# Patient Record
Sex: Male | Born: 1950 | Race: White | Hispanic: No | Smoking: Former smoker
Health system: Southern US, Community
[De-identification: ages and names within clinical notes are randomized; demographics above are authoritative.]

## PROBLEM LIST (undated history)

## (undated) DIAGNOSIS — J45909 Unspecified asthma, uncomplicated: Secondary | ICD-10-CM

## (undated) DIAGNOSIS — I10 Essential (primary) hypertension: Secondary | ICD-10-CM

## (undated) DIAGNOSIS — L309 Dermatitis, unspecified: Secondary | ICD-10-CM

## (undated) DIAGNOSIS — J449 Chronic obstructive pulmonary disease, unspecified: Secondary | ICD-10-CM

## (undated) DIAGNOSIS — R569 Unspecified convulsions: Secondary | ICD-10-CM

## (undated) HISTORY — DX: Chronic obstructive pulmonary disease, unspecified: J44.9

## (undated) HISTORY — DX: Unspecified convulsions: R56.9

## (undated) HISTORY — DX: Unspecified asthma, uncomplicated: J45.909

## (undated) HISTORY — DX: Essential (primary) hypertension: I10

## (undated) HISTORY — PX: GALLBLADDER SURGERY: SHX652

## (undated) HISTORY — DX: Dermatitis, unspecified: L30.9

## (undated) HISTORY — PX: LEG SURGERY: SHX1003

---

## 1999-08-24 ENCOUNTER — Ambulatory Visit (HOSPITAL_COMMUNITY): Admission: RE | Admit: 1999-08-24 | Discharge: 1999-08-24 | Payer: Self-pay | Admitting: Infectious Diseases

## 1999-08-24 ENCOUNTER — Encounter: Admission: RE | Admit: 1999-08-24 | Discharge: 1999-08-24 | Payer: Self-pay | Admitting: Infectious Diseases

## 1999-09-07 ENCOUNTER — Encounter: Admission: RE | Admit: 1999-09-07 | Discharge: 1999-09-07 | Payer: Self-pay | Admitting: Infectious Diseases

## 1999-09-07 ENCOUNTER — Ambulatory Visit (HOSPITAL_COMMUNITY): Admission: RE | Admit: 1999-09-07 | Discharge: 1999-09-07 | Payer: Self-pay | Admitting: Infectious Diseases

## 1999-09-28 ENCOUNTER — Encounter: Admission: RE | Admit: 1999-09-28 | Discharge: 1999-09-28 | Payer: Self-pay | Admitting: Infectious Diseases

## 1999-10-25 ENCOUNTER — Encounter: Admission: RE | Admit: 1999-10-25 | Discharge: 1999-10-25 | Payer: Self-pay | Admitting: Infectious Diseases

## 1999-11-08 ENCOUNTER — Ambulatory Visit (HOSPITAL_COMMUNITY): Admission: RE | Admit: 1999-11-08 | Discharge: 1999-11-08 | Payer: Self-pay | Admitting: Infectious Diseases

## 1999-11-08 ENCOUNTER — Encounter: Admission: RE | Admit: 1999-11-08 | Discharge: 1999-11-08 | Payer: Self-pay | Admitting: Infectious Diseases

## 1999-11-15 ENCOUNTER — Encounter: Admission: RE | Admit: 1999-11-15 | Discharge: 1999-11-15 | Payer: Self-pay | Admitting: Infectious Diseases

## 1999-12-06 ENCOUNTER — Encounter: Admission: RE | Admit: 1999-12-06 | Discharge: 1999-12-06 | Payer: Self-pay | Admitting: Infectious Diseases

## 1999-12-06 ENCOUNTER — Ambulatory Visit (HOSPITAL_COMMUNITY): Admission: RE | Admit: 1999-12-06 | Discharge: 1999-12-06 | Payer: Self-pay | Admitting: Infectious Diseases

## 1999-12-29 ENCOUNTER — Encounter: Admission: RE | Admit: 1999-12-29 | Discharge: 1999-12-29 | Payer: Self-pay | Admitting: Infectious Diseases

## 1999-12-29 ENCOUNTER — Ambulatory Visit (HOSPITAL_COMMUNITY): Admission: RE | Admit: 1999-12-29 | Discharge: 1999-12-29 | Payer: Self-pay | Admitting: Infectious Diseases

## 2000-01-24 ENCOUNTER — Encounter: Admission: RE | Admit: 2000-01-24 | Discharge: 2000-01-24 | Payer: Self-pay | Admitting: Infectious Diseases

## 2000-01-25 ENCOUNTER — Ambulatory Visit (HOSPITAL_COMMUNITY): Admission: RE | Admit: 2000-01-25 | Discharge: 2000-01-25 | Payer: Self-pay | Admitting: Infectious Diseases

## 2000-02-15 ENCOUNTER — Encounter: Payer: Self-pay | Admitting: General Surgery

## 2000-02-16 ENCOUNTER — Ambulatory Visit (HOSPITAL_COMMUNITY): Admission: RE | Admit: 2000-02-16 | Discharge: 2000-02-16 | Payer: Self-pay | Admitting: General Surgery

## 2000-02-21 ENCOUNTER — Encounter: Admission: RE | Admit: 2000-02-21 | Discharge: 2000-02-21 | Payer: Self-pay | Admitting: Infectious Diseases

## 2009-06-08 ENCOUNTER — Observation Stay: Payer: Self-pay | Admitting: Internal Medicine

## 2009-06-08 ENCOUNTER — Ambulatory Visit: Payer: Self-pay | Admitting: Cardiovascular Disease

## 2011-02-12 IMAGING — US US CAROTID DUPLEX BILAT
1 series · 17 of 24 positions shown · non-contrast
Comparison: none

REASON FOR EXAM: dizziness
COMMENTS:

[Series 1: us carotid duplex bilat · 17 of 62 slices shown]
[im 1/62]
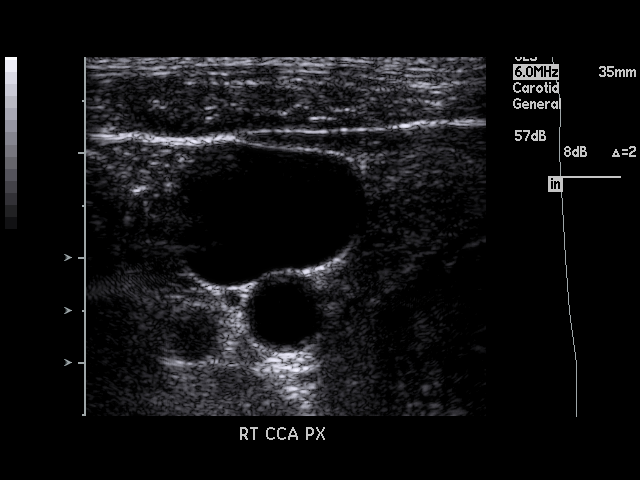
[im 6/62]
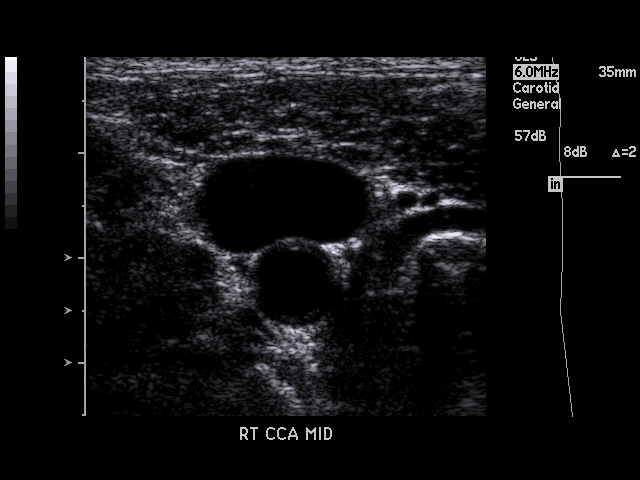
[im 8/62]
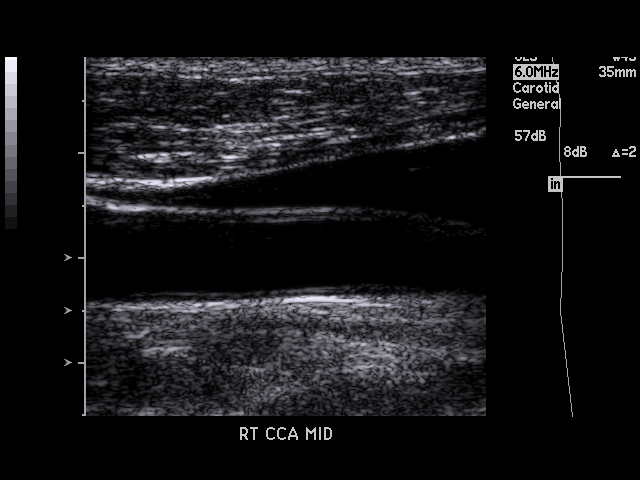
[im 11/62]
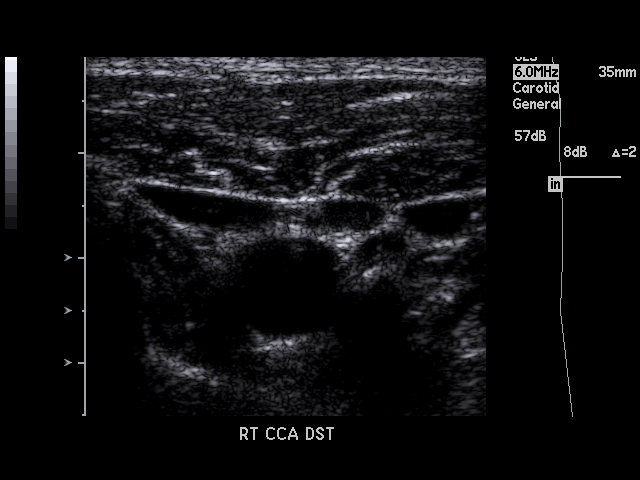
[im 16/62]
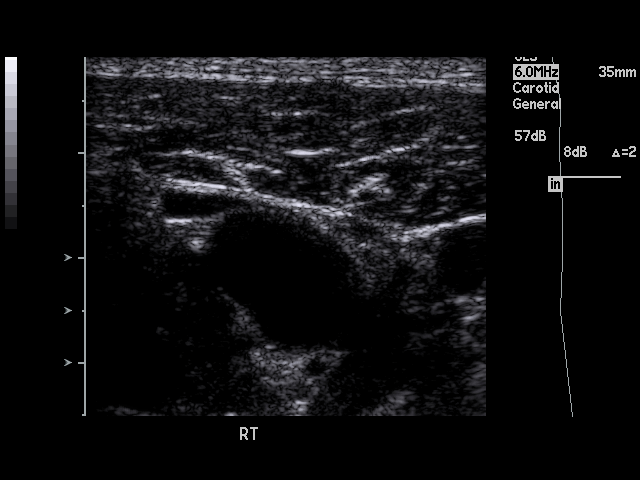
[im 19/62]
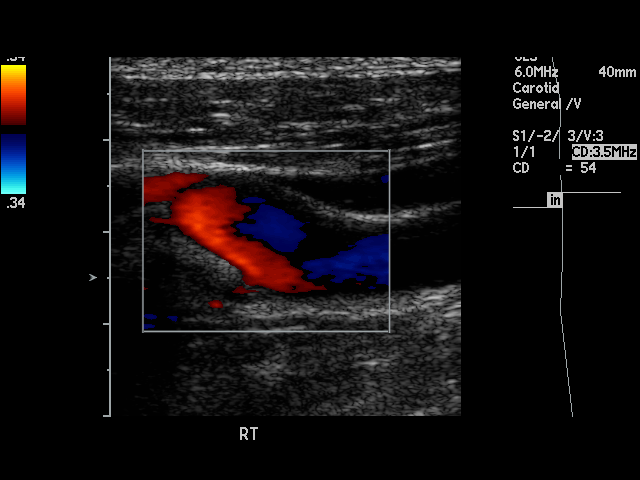
[im 24/62]
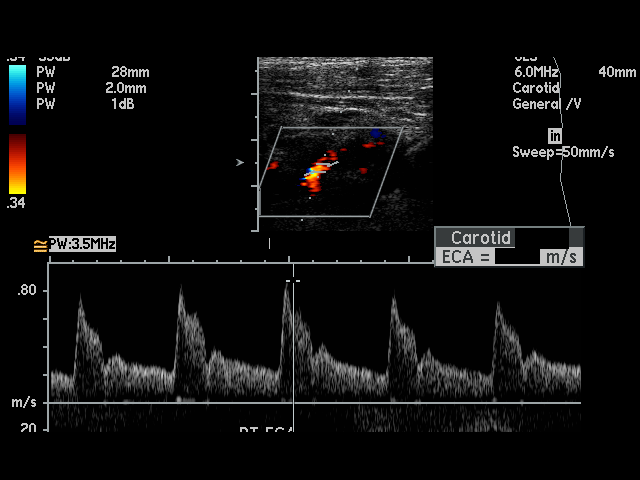
[im 27/62]
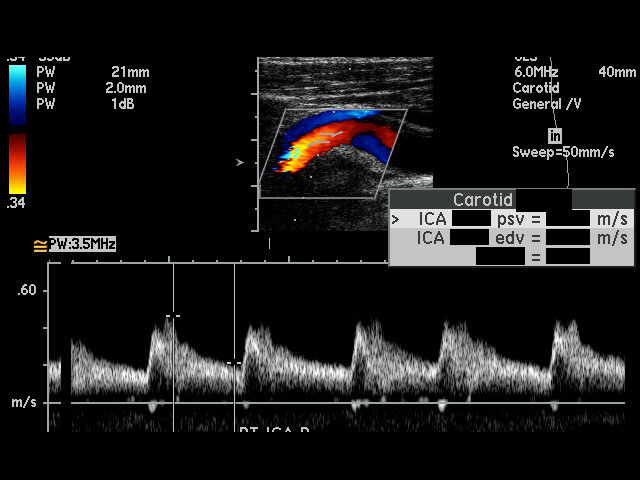
[im 32/62]
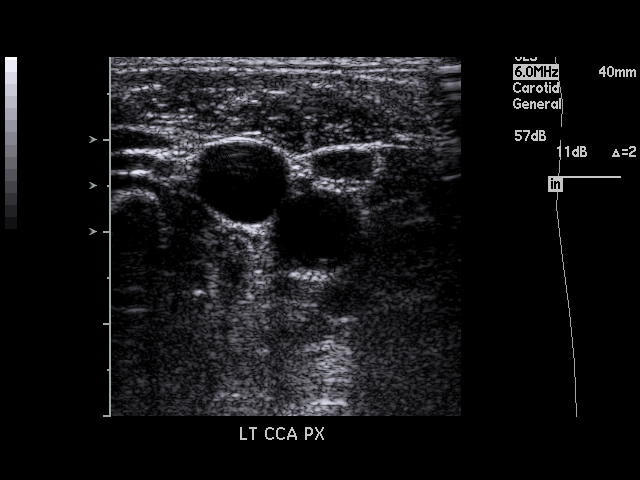
[im 35/62]
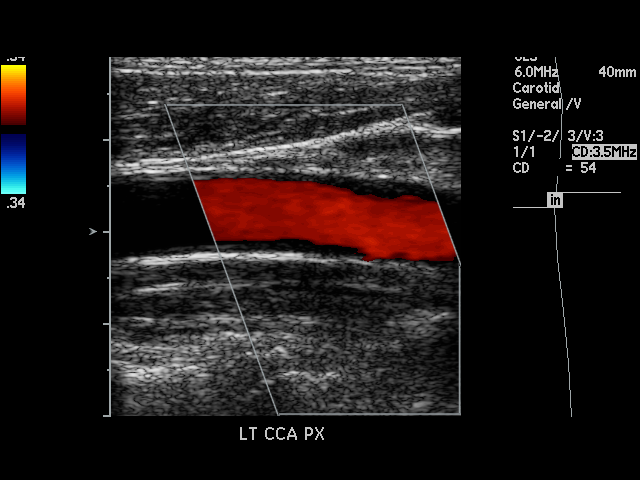
[im 38/62]
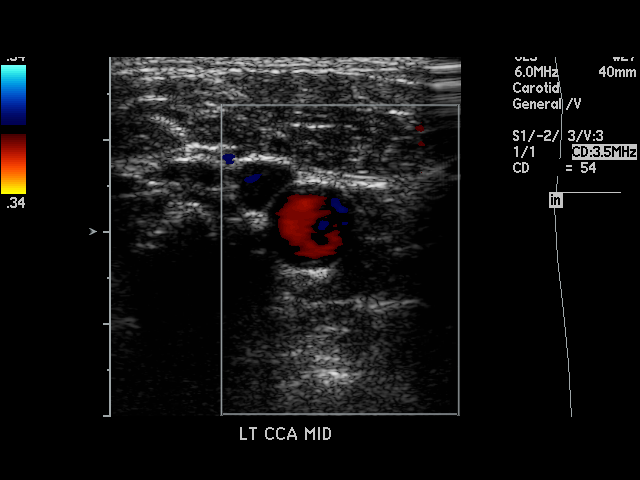
[im 43/62]
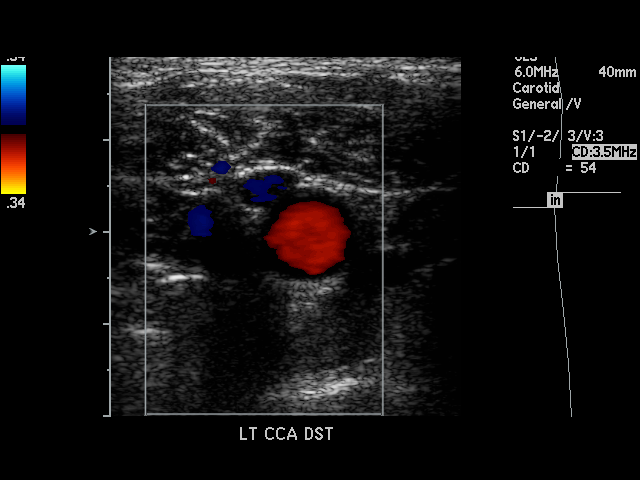
[im 46/62]
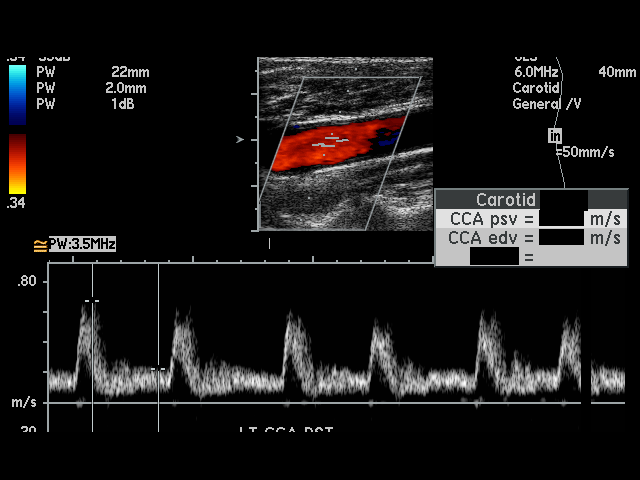
[im 51/62]
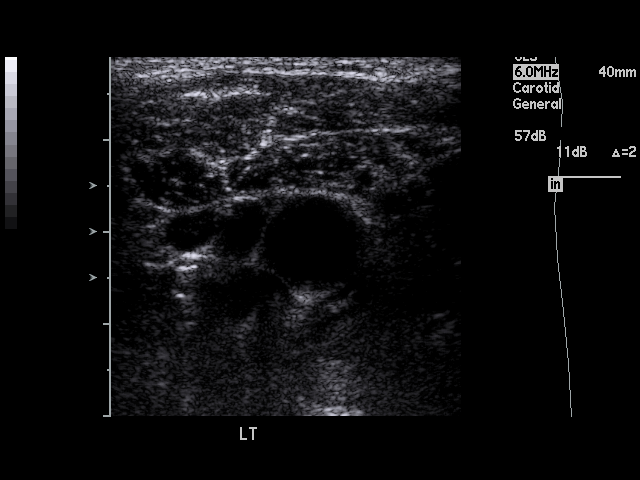
[im 54/62]
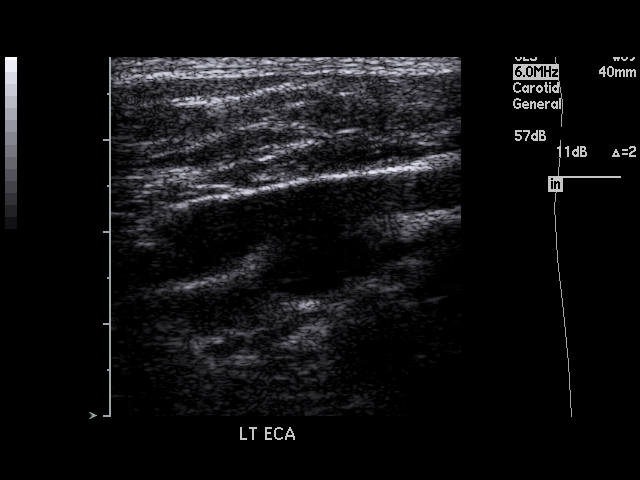
[im 56/62]
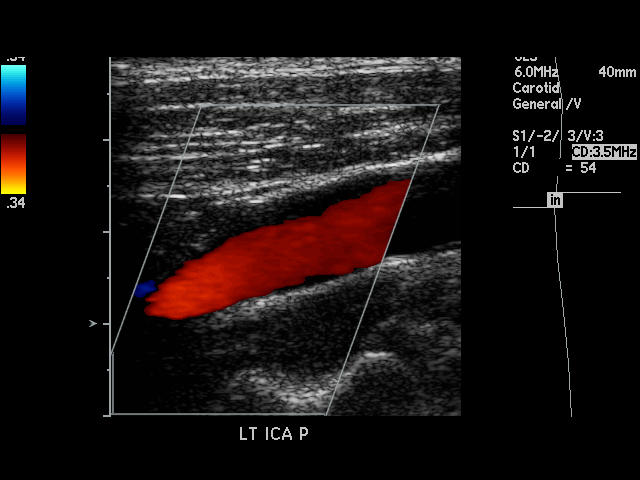
[im 62/62]
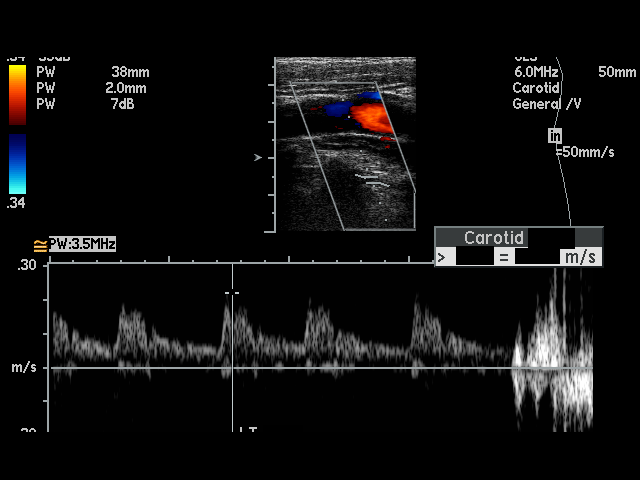

[17 of 24 positions shown; findings below may reference images not displayed]

PROCEDURE:     US  - US CAROTID DOPPLER BILATERAL  - June 08, 2009 [DATE]

RESULT:     Doppler interrogation of the carotids demonstrates no
significant atherosclerotic disease or visual evidence of stenosis. The
color and spectral Doppler appearance is unremarkable. The peak systolic
velocities are normal. The internal to common carotid peak systolic velocity
ratios are 0.77 on the right and 0.81 on the left. Antegrade flow is present
in both vertebral arteries.
IMPRESSION: No evidence of atherosclerotic disease or hemodynamically significant
stenosis.

## 2019-10-20 ENCOUNTER — Ambulatory Visit (INDEPENDENT_AMBULATORY_CARE_PROVIDER_SITE_OTHER): Payer: No Typology Code available for payment source | Admitting: Allergy

## 2019-10-20 ENCOUNTER — Encounter: Payer: Self-pay | Admitting: Allergy

## 2019-10-20 ENCOUNTER — Other Ambulatory Visit: Payer: Self-pay

## 2019-10-20 VITALS — BP 122/78 | HR 84 | Resp 16 | Ht 70.0 in | Wt 183.8 lb

## 2019-10-20 DIAGNOSIS — J3089 Other allergic rhinitis: Secondary | ICD-10-CM

## 2019-10-20 DIAGNOSIS — J4550 Severe persistent asthma, uncomplicated: Secondary | ICD-10-CM | POA: Diagnosis not present

## 2019-10-20 DIAGNOSIS — H1013 Acute atopic conjunctivitis, bilateral: Secondary | ICD-10-CM

## 2019-10-20 DIAGNOSIS — J411 Mucopurulent chronic bronchitis: Secondary | ICD-10-CM

## 2019-10-20 DIAGNOSIS — A499 Bacterial infection, unspecified: Secondary | ICD-10-CM

## 2019-10-20 MED ORDER — AZELASTINE HCL 0.1 % NA SOLN: NASAL | 5 refills | Status: AC

## 2019-10-20 NOTE — Progress Notes (Signed)
New Patient Note  RE: Amato Sevillano MRN: [id number] DOB: 09/02/1950 Date of Office Visit: 10/20/2019  Referring provider: Chase Picket, PA-C Primary care provider: Horald Pollen, MD  Chief Complaint: allergy testing  History of present illness: Gary West is a 69 y.o. male presenting today for consultation for allergy testing. He has history of severe COPD with overlapping asthma, multiple episodes of pulmonary infections (most recently treated for pseudomonas and serratia in July 2021) as well as HIV on biktarvy with undetectable viral load and cd4 420 from April 2021.  He has elevated eosinophils (more recent 590) and IgE (most recent 1491) on testing.  Referral made for allergy testing.    He reports he has ben having difficulty breathing and shortness of breath.  He states he has wheezing.  He has been unable to cough the sputum up due to the thickness.  He states he will take mucinex that that does loosen it and he can cough up chunks.  He uses albuterol just about every day.  He states he takes [place] twice a day and Spiriva once a day.  He states he has been on these inhalers over the past 2 months.  He does feel like these inhalers are helping.   He states sometimes he will get prednisone when symptoms really flare.  He states when he is on antibiotics and prednisone he does well but when these courses complete the symptoms return right back.  He states it has been at least 2 months since the last time he had antibiotics.   He states he will be seeing his pulmonologist this week due to ongoing symptoms.  He reports having nasal congestion, drainage, sneezing, itchy/watery eyes and itchy ears with worst season being fall, spring but states this summer has been awful.   He uses flonase and states it does help.  He does not take any antihistamines.   He states years ago he use to take claritin which was changed to something else but states he just stop getting it and does not know  why.  He is on singulair and states its a "miracle" medication for him.    He had allergy testing back in 1984 and states he was allergic to everything.   No history of food allergy or eczema.    [facility]: [facility]BAL performed 09/15/19 - report noted thick secretions throughout.  BAL sent for testing  His pulmonary infectious history (besides noted above) included stenotrophomonas and psuedomonas infection in 2015.  He has a history of right middle lobe collapse in 2015.  He also has had waxing and waning pulmonary nodules on imaging.  He has had negative Aspergillus antigens in the past.  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  His last CT scan in May 2021 show pulmonary nodules, tree-in-bud and groundglass opacities with mucus mucus plugging concerning for atypical mycobacterial infection versus chronic aspiration.  He however has had a normal swallow study.  Review of systems: Review of Systems  Constitutional: Negative.   HENT:       See HPI  Eyes:       See HPI  Respiratory:       See HPI  Cardiovascular: Negative.   Gastrointestinal: Negative.   Musculoskeletal: Negative.   Skin: Negative.   Neurological: Negative.     All other systems negative unless noted above in HPI  Past medical history: Past Medical History:  Diagnosis Date  .  Asthma   . COPD (chronic obstructive pulmonary disease) (HCC)   . Eczema   . Hypertension   . Seizures (HCC)     Past surgical history: Past Surgical History:  Procedure Laterality Date  . GALLBLADDER SURGERY    . LEG SURGERY      Family history:  Family History  Problem Relation Age of Onset  . Heart disease Mother   . Asthma Father   . Lung cancer Father   . Cancer Father   . Asthma Sister   . Hypertension Sister   . Stroke Sister   . Asthma Brother   . Hypertension Brother     Social history: Lives in a home with carpeting with electric and heat pump  heating and heat pump cooling.  No pets in the home.  There is no concern for water damage, mildew or roaches in the home.  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  He no longer smokes but he does have a smoking history.  Medication List: Current Outpatient Medications  Medication Sig Dispense Refill  . albuterol (VENTOLIN HFA) 108 (90 Base) MCG/ACT inhaler Inhale 2 puffs into the lungs every 4 (four) hours as needed for wheezing or shortness of breath.    . Bictegravir-Emtricitab-Tenofov (BIKTARVY PO) Take by mouth.    . fluticasone (FLONASE) 50 MCG/ACT nasal spray Place into both nostrils.    Marland Kitchen guaiFENesin (MUCINEX PO) Take by mouth.    . levETIRAcetam (KEPPRA) 500 MG tablet Take 500 mg by mouth 2 (two) times daily.    Marland Kitchen lisinopril (ZESTRIL) 10 MG tablet Take by mouth.    . montelukast (SINGULAIR) 10 MG tablet     . naproxen (NAPROSYN) 250 MG tablet     . omeprazole (PRILOSEC) 20 MG capsule Take 20 mg by mouth daily.    Marland Kitchen SPIRIVA RESPIMAT 2.5 MCG/ACT AERS SMARTSIG:2 Puff(s) Via Inhaler Daily    . WIXELA INHUB 250-50 MCG/DOSE AEPB Inhale 1 puff into the lungs 2 (two) times daily.    Marland Kitchen azelastine (ASTELIN) 0.1 % nasal spray Use two sprays in each nostril 1-2 times daily. 30 mL 5   No current facility-administered medications for this visit.    Known medication allergies: No Known Allergies   Physical examination: Blood pressure 122/78, pulse 84, resp. rate 16, height 5\' 10"  (1.778 m), weight 183 lb 12.8 oz (83.4 kg), SpO2 95 %.  General: Alert, interactive, in no acute distress. HEENT: PERRLA, TMs pearly gray, turbinates mildly edematous without discharge, post-pharynx non erythematous. Neck: Supple without lymphadenopathy. Lungs: Decreased breath sounds bilaterally without wheezing, rhonchi or rales. {no increased work of breathing. CV: Normal S1, S2 without murmurs. Abdomen: Nondistended, nontender. Skin: Warm and dry, without lesions or rashes. Extremities:  No clubbing, cyanosis or  edema. Neuro:   Grossly intact.  Diagnositics/Labs: Labs: [facility] reviewed as [facility]  Spirometry: FEV1: 0.92 L 28%, FVC: 1.92 L 43%, ratio consistent with Severe obstructive with restrictive pattern.  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    - for allergy symptom control recommend starting long-acting antihistamine like Xyzal 5mg  or Allegra 180mg  daily  - continue Flonase 2 spray each nostril daily for nasal congestion control.  Use for 1-2 weeks at a time before stopping once symptoms are improved  - for nasal drainage control recommend use of nasal antihistamine, Astelin 2 sprays each nostril 1-2 times a day   - will obtain some labwork today to see what you are allergic to in the environment as well as labs to assess your immune system.  The immune labs will be screening labs and further testing may be necessary based on results.  You may benefit from CGD (chronic granulomatous disease) testing which is a send-out lab that we will have to arrange as you have had certain bacteria detected in the airway that is common in CGD.  CGD is usually picked up in childhood however it can be present later in life.  CGD usually involves  developments of granulomas which are pockets of infection that can be found in organs like the lungs or liver.  Per review of your records I do not see any mention of granulomas seen on lung imaging.    We will call you with results  Follow-up in 3-4 months or sooner if needed  I appreciate the opportunity to take part in Lajuane's care. Please do not hesitate to contact me with questions.  Sincerely,   , MD [facility]ergy/[facility]

## 2019-10-20 NOTE — Patient Instructions (Signed)
 -   continue follow-up care with your pulmonary specialist  - continue inhaler medications as directed by your pulmonary specialist including Wixela, Spiriva, Singulair and as needed albuterol  - based on your eosinophil count you may have some benefit from Fasenra injections for improved asthma control  - also discussed option of prophylactic antibiotics to help decrease bacterial load in airway however would await lab results and recommendations from your pulmonologist in regards to this.    - for allergy symptom control recommend starting long-acting antihistamine like Xyzal 5mg  or Allegra 180mg  daily  - continue Flonase 2 spray each nostril daily for nasal congestion control.  Use for 1-2 weeks at a time before stopping once symptoms are improved  - for nasal drainage control recommend use of nasal antihistamine, Astelin 2 sprays each nostril 1-2 times a day   - will obtain some labwork today to see what you are allergic to in the environment as well as labs to assess your immune system.  The immune labs will be screening labs and further testing may be necessary based on results.  You may benefit from CGD (chronic granulomatous disease) testing which is a send-out lab that we will have to arrange as you have had certain bacteria detected in the airway that is common in CGD.  CGD is usually picked up in childhood however it can be present later in life.  CGD usually involves developments of granulomas which are pockets of infection that can be found in organs like the lungs or liver.  Per review of your records I do not see any mention of granulomas seen on lung imaging.    We will call you with results  Follow-up in 3-4 months or sooner if needed

## 2019-11-04 LAB — STREP PNEUMONIAE 23 SEROTYPES IGG
Pneumo Ab Type 1*: 3.2 ug/mL (ref >1.3)
Pneumo Ab Type 12 (12F)*: 0.9 ug/mL — ABNORMAL LOW (ref >1.3)
Pneumo Ab Type 14*: 11.3 ug/mL (ref >1.3)
Pneumo Ab Type 17 (17F)*: 3.9 ug/mL (ref >1.3)
Pneumo Ab Type 19 (19F)*: 6.4 ug/mL (ref >1.3)
Pneumo Ab Type 2*: 3.8 ug/mL (ref >1.3)
Pneumo Ab Type 20*: 36.6 ug/mL (ref >1.3)
Pneumo Ab Type 22 (22F)*: 2.9 ug/mL (ref >1.3)
Pneumo Ab Type 23 (23F)*: 16.2 ug/mL (ref >1.3)
Pneumo Ab Type 26 (6B)*: 14.1 ug/mL (ref >1.3)
Pneumo Ab Type 3*: 15.1 ug/mL (ref >1.3)
Pneumo Ab Type 34 (10A)*: 4.1 ug/mL (ref >1.3)
Pneumo Ab Type 4*: 12.8 ug/mL (ref >1.3)
Pneumo Ab Type 43 (11A)*: 2.1 ug/mL (ref >1.3)
Pneumo Ab Type 5*: 19.2 ug/mL (ref >1.3)
Pneumo Ab Type 51 (7F)*: 2.9 ug/mL (ref >1.3)
Pneumo Ab Type 54 (15B)*: 5.6 ug/mL (ref >1.3)
Pneumo Ab Type 56 (18C)*: >14.4 ug/mL (ref >1.3)
Pneumo Ab Type 57 (19A)*: 18.5 ug/mL (ref >1.3)
Pneumo Ab Type 68 (9V)*: 3.4 ug/mL (ref >1.3)
Pneumo Ab Type 70 (33F)*: 11.1 ug/mL (ref >1.3)
Pneumo Ab Type 8*: 5.7 ug/mL (ref >1.3)
Pneumo Ab Type 9 (9N)*: 1.5 ug/mL (ref >1.3)

## 2019-11-04 LAB — COMPLEMENT, TOTAL: Compl, Total (CH50): >60 U/mL (ref >41)

## 2019-11-04 LAB — ALLERGENS W/TOTAL IGE AREA 2
Alternaria Alternata IgE: 7.89 kU/L — AB
Aspergillus Fumigatus IgE: 30.9 kU/L — AB
Bermuda Grass IgE: 0.48 kU/L — AB
Cat Dander IgE: 0.57 kU/L — AB
Cedar, Mountain IgE: 0.44 kU/L — AB
Cladosporium Herbarum IgE: 1.69 kU/L — AB
Cockroach, German IgE: 0.21 kU/L — AB
Common Silver Birch IgE: 0.28 kU/L — AB
Cottonwood IgE: 0.62 kU/L — AB
D Farinae IgE: 2.87 kU/L — AB
D Pteronyssinus IgE: 3.63 kU/L — AB
Dog Dander IgE: 0.5 kU/L — AB
Elm, American IgE: 0.53 kU/L — AB
IgE (Immunoglobulin E), Serum: 2615 IU/mL — ABNORMAL HIGH (ref 6–495)
Johnson Grass IgE: 0.54 kU/L — AB
Maple/Box Elder IgE: 0.55 kU/L — AB
Mouse Urine IgE: <0.1 kU/L
Oak, White IgE: 0.47 kU/L — AB
Pecan, Hickory IgE: 0.34 kU/L — AB
Penicillium Chrysogen IgE: 6.33 kU/L — AB
Pigweed, Rough IgE: 0.46 kU/L — AB
Ragweed, Short IgE: 0.53 kU/L — AB
Sheep Sorrel IgE Qn: 0.38 kU/L — AB
Timothy Grass IgE: 0.54 kU/L — AB
White Mulberry IgE: 0.23 kU/L — AB

## 2019-11-04 LAB — T + B-LYMPHOCYTE DIFFERENTIAL
% CD 3 Pos. Lymph.: 78.3 % (ref 57.5–86.2)
% CD 4 Pos. Lymph.: 27.1 % — ABNORMAL LOW (ref 30.8–58.5)
Absolute CD 3: 1644 /uL (ref 622–2402)
Absolute CD 4 Helper: 569 /uL (ref 359–1519)
Basophils Absolute: 0.1 10*3/uL (ref 0.0–0.2)
Basos: 1 %
CD19 % B Cell: 5.5 % (ref 3.3–25.4)
CD19 Abs: 116 /uL (ref 12–645)
CD4/CD8 Ratio: 0.49 — ABNORMAL LOW (ref 0.92–3.72)
CD8 % Suppressor T Cell: 55.1 % — ABNORMAL HIGH (ref 12.0–35.5)
CD8 T Cell Abs: 1157 /uL — ABNORMAL HIGH (ref 109–897)
EOS (ABSOLUTE): 1.1 10*3/uL — ABNORMAL HIGH (ref 0.0–0.4)
Eos: 15 %
Hematocrit: 46.1 % (ref 37.5–51.0)
Hemoglobin: 15.8 g/dL (ref 13.0–17.7)
Immature Grans (Abs): 0 10*3/uL (ref 0.0–0.1)
Immature Granulocytes: 1 %
Lymphocytes Absolute: 2.1 10*3/uL (ref 0.7–3.1)
Lymphs: 29 %
MCH: 31.3 pg (ref 26.6–33.0)
MCHC: 34.3 g/dL (ref 31.5–35.7)
MCV: 91 fL (ref 79–97)
Monocytes Absolute: 0.7 10*3/uL (ref 0.1–0.9)
Monocytes: 9 %
Neutrophils Absolute: 3.2 10*3/uL (ref 1.4–7.0)
Neutrophils: 45 %
Platelets: 226 10*3/uL (ref 150–450)
RBC: 5.05 x10E6/uL (ref 4.14–5.80)
RDW: 12.8 % (ref 11.6–15.4)
WBC: 7.1 10*3/uL (ref 3.4–10.8)

## 2019-11-04 LAB — PAN-ANCA
ANCA Proteinase 3: <3.5 U/mL (ref 0.0–3.5)
Atypical pANCA: <1:20 {titer}
C-ANCA: <1:20 {titer}
Myeloperoxidase Ab: <9 U/mL (ref 0.0–9.0)
P-ANCA: <1:20 {titer}

## 2019-11-04 LAB — DIPHTHERIA / TETANUS ANTIBODY PANEL
Diphtheria Ab: 0.99 IU/mL (ref <0.10)
Tetanus Ab, IgG: 1.49 IU/mL (ref <0.10)

## 2019-11-04 LAB — IGG, IGA, IGM
IgA/Immunoglobulin A, Serum: 397 mg/dL (ref 61–437)
IgG (Immunoglobin G), Serum: 1181 mg/dL (ref 603–1613)
IgM (Immunoglobulin M), Srm: 59 mg/dL (ref 20–172)
